# Patient Record
Sex: Female | Born: 1995 | Race: White | Hispanic: No | Marital: Single | Smoking: Never smoker
Health system: Southern US, Community
[De-identification: ages and names within clinical notes are randomized; demographics above are authoritative.]

## PROBLEM LIST (undated history)

## (undated) DIAGNOSIS — G43909 Migraine, unspecified, not intractable, without status migrainosus: Secondary | ICD-10-CM

## (undated) DIAGNOSIS — E785 Hyperlipidemia, unspecified: Secondary | ICD-10-CM

## (undated) HISTORY — PX: CHOLECYSTECTOMY: SHX55

---
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
## 2017-11-24 ENCOUNTER — Emergency Department (HOSPITAL_BASED_OUTPATIENT_CLINIC_OR_DEPARTMENT_OTHER)
Admission: EM | Admit: 2017-11-24 | Discharge: 2017-11-25 | Disposition: A | Discharge status: Home / Self Care | Payer: Self-pay | Attending: Emergency Medicine | Admitting: Emergency Medicine

## 2017-11-24 ENCOUNTER — Encounter (HOSPITAL_BASED_OUTPATIENT_CLINIC_OR_DEPARTMENT_OTHER): Payer: Self-pay | Admitting: Adult Health

## 2017-11-24 ENCOUNTER — Other Ambulatory Visit: Payer: Self-pay

## 2017-11-24 ENCOUNTER — Emergency Department (HOSPITAL_BASED_OUTPATIENT_CLINIC_OR_DEPARTMENT_OTHER): Payer: Self-pay

## 2017-11-24 DIAGNOSIS — G43009 Migraine without aura, not intractable, without status migrainosus: Secondary | ICD-10-CM | POA: Insufficient documentation

## 2017-11-24 DIAGNOSIS — R0789 Other chest pain: Secondary | ICD-10-CM | POA: Insufficient documentation

## 2017-11-24 DIAGNOSIS — H53149 Visual discomfort, unspecified: Secondary | ICD-10-CM | POA: Insufficient documentation

## 2017-11-24 HISTORY — DX: Hyperlipidemia, unspecified: E78.5

## 2017-11-24 HISTORY — DX: Migraine, unspecified, not intractable, without status migrainosus: G43.909

## 2017-11-24 MED ORDER — PROCHLORPERAZINE EDISYLATE 5 MG/ML IJ SOLN
5.0000 mg | Freq: Once | INTRAMUSCULAR | Status: DC
  Filled 2017-11-24: qty 2

## 2017-11-24 MED ORDER — IBUPROFEN 400 MG PO TABS: 400.0000 mg | ORAL_TABLET | Freq: Once | ORAL | Status: DC | PRN

## 2017-11-24 MED ORDER — KETOROLAC TROMETHAMINE 30 MG/ML IJ SOLN
30.0000 mg | Freq: Once | INTRAMUSCULAR | Status: DC
  Filled 2017-11-24: qty 1

## 2017-11-24 MED ORDER — DIPHENHYDRAMINE HCL 50 MG/ML IJ SOLN
25.0000 mg | Freq: Once | INTRAMUSCULAR | Status: DC
  Filled 2017-11-24: qty 1

## 2017-11-24 NOTE — ED Triage Notes (Addendum)
Presents with headache, "I typically get really bad migraines, so I went and laid down. I get dizzy with the migraiens but when I woke up at 5 pm the dizziness was bad and my chest was really tight" Endorses photophobia, chest tightness, dizziness and SOB with the chest pain.  Tried tylenol for pain without relief.

## 2017-11-24 NOTE — ED Notes (Addendum)
Alert, NAD, calm, interactive, resps e/u, speaking in clear complete sentences, no dyspnea noted, skin W&D, VSS, here for HA and CP, reports CP improved, now 3/10, HA remains 8/10, also mentions mild sob, blurry vision, light sensitivity, dizziness, (denies: fever, VD, syncope, palpitations, recent illness, cough, congestion, facial pain, or bleeding; also denies: leg pain, recent surgery, long distance travel, smoking or birth control pills). Some minimal relief with Tylenol PTA. Has been prescribed Fiorcet in the past. Has never been referred to/ seen by a neurologist. [facility]

## 2017-11-25 MED ORDER — KETOROLAC TROMETHAMINE 30 MG/ML IJ SOLN
30.0000 mg | Freq: Once | INTRAMUSCULAR | Status: AC
  Administered 2017-11-25: 30 mg via INTRAVENOUS

## 2017-11-25 MED ORDER — DIPHENHYDRAMINE HCL 50 MG/ML IJ SOLN
25.0000 mg | Freq: Once | INTRAMUSCULAR | Status: AC
  Administered 2017-11-25: 25 mg via INTRAVENOUS

## 2017-11-25 MED ORDER — PROCHLORPERAZINE EDISYLATE 5 MG/ML IJ SOLN
5.0000 mg | Freq: Once | INTRAMUSCULAR | Status: AC
  Administered 2017-11-25: 5 mg via INTRAVENOUS

## 2017-11-25 NOTE — ED Provider Notes (Signed)
[facility] EMERGENCY DEPARTMENT Provider Note   CSN: [id number] Arrival date & time: 11/24/17  1919     History   Chief Complaint Chief Complaint  Patient presents with  . Chest Pain    HPI Amber Watkins is a 22 y.o. female.  HPI  This is a 22 year old female with a history of migraines who presents with headache and chest pain.  Patient reports history of migraines.  However she has not had one in several years.  She has had progressive onset of anterior headache.  She took over-the-counter medications at home with minimal relief.  Currently she rates her pain 8 out of 10.  She also reports developing left-sided chest pain.  Denies shortness of breath.  Denies any recent cough or fevers.  No history of the same.  Nothing seems to make it better or worse.  She denies any vision changes.  She does report photophobia.  Past Medical History:  Diagnosis Date  . Hyperlipidemia   . Migraines     There are no active problems to display for this patient.   History reviewed. No pertinent surgical history.  OB History    No data available       Home Medications    Prior to Admission medications   Not on File    Family History History reviewed. No pertinent family history.  Social History Social History   Tobacco Use  . Smoking status: Never Smoker  . Smokeless tobacco: Never Used  Substance Use Topics  . Alcohol use: No    Frequency: Never  . Drug use: No     Allergies   Patient has no known allergies.   Review of Systems Review of Systems  Constitutional: Negative for fever.  Eyes: Positive for photophobia.  Respiratory: Negative for cough and shortness of breath.   Cardiovascular: Positive for chest pain. Negative for leg swelling.  Gastrointestinal: Negative for abdominal pain.  Neurological: Positive for headaches. Negative for dizziness.  All other systems reviewed and are negative.    Physical Exam Updated Vital Signs BP 109/70    Pulse (!) 58   Temp 98.6 F (37 C)   Resp 16   LMP 11/14/2017 (Exact Date)   SpO2 100%   Physical Exam  Constitutional: She is oriented to person, place, and time. She appears well-developed and well-nourished.  Overweight, no acute distress  HENT:  Head: Normocephalic and atraumatic.  Eyes: Pupils are equal, round, and reactive to light.  Neck: Neck supple.  Normal range of motion, no meningismus  Cardiovascular: Normal rate, regular rhythm and normal heart sounds.  Left-sided chest wall tenderness to palpation, no crepitus  Pulmonary/Chest: Effort normal. No respiratory distress. She has no wheezes.  Abdominal: Soft. Bowel sounds are normal.  Neurological: She is alert and oriented to person, place, and time.  Skin: Skin is warm and dry.  Psychiatric: She has a normal mood and affect.  Nursing note and vitals reviewed.    ED Treatments / Results  Labs (all labs ordered are listed, but only abnormal results are displayed) Labs Reviewed - No data to display  EKG  EKG Interpretation  Date/Time:  Sunday November 24 2017 19:25:55 EST Ventricular Rate:  70 PR Interval:  130 QRS Duration: 88 QT Interval:  394 QTC Calculation: 425 R Axis:   74 Text Interpretation:  Normal sinus rhythm with sinus arrhythmia Early repolarization Normal ECG Confirmed by Tilden Fossa [phone]) on 11/24/2017 7:33:58 PM Also confirmed by Ross Marcus ([id number])  on 11/24/2017 10:56:35 PM       Radiology Dg Chest 2 View  Result Date: 11/24/2017 CLINICAL DATA:  Chest pain. EXAM: CHEST  2 VIEW COMPARISON:  None FINDINGS: The cardiomediastinal silhouette is within normal limits. The lungs are well inflated and clear. There is no evidence of pleural effusion or pneumothorax. No acute osseous abnormality is identified. IMPRESSION: No active cardiopulmonary disease. Electronically Signed   By: Sebastian AcheAllen  Grady M.D.   On: 11/24/2017 21:02    Procedures Procedures (including critical care time)  Medications  Ordered in ED Medications  diphenhydrAMINE (BENADRYL) injection 25 mg (25 mg Intravenous Given 11/25/17 0009)  prochlorperazine (COMPAZINE) injection 5 mg (5 mg Intravenous Given 11/25/17 0009)  ketorolac (TORADOL) 30 MG/ML injection 30 mg (30 mg Intravenous Given 11/25/17 0009)     Initial Impression / Assessment and Plan / ED Course  I have reviewed the triage vital signs and the nursing notes.  Pertinent labs & imaging results that were available during my care of the patient were reviewed by me and considered in my medical decision making (see chart for details).     Patient presents with headache and chest pain.  Reports history of migraines.  No red flags of headache.  Doubt subarachnoid hemorrhage or meningitis.  Additionally, she has reproducible chest pain.  EKG and chest x-ray are reassuring.  No evidence of arrhythmia.  Vital signs are reassuring.  Doubt PE.  Patient was treated with a migraine cocktail.  On recheck, she states she feels much better.  Will discharge home with PCP follow-up.  After history, exam, and medical workup I feel the patient has been appropriately medically screened and is safe for discharge home. Pertinent diagnoses were discussed with the patient. Patient was given return precautions.   Final Clinical Impressions(s) / ED Diagnoses   Final diagnoses:  Migraine without aura and without status migrainosus, not intractable  Chest wall pain    ED Discharge Orders    None       Shon BatonHorton, Dwon Sky F, MD 11/25/17 0128

## 2018-01-20 ENCOUNTER — Telehealth: Payer: Self-pay | Admitting: Nutrition

## 2018-01-20 NOTE — Telephone Encounter (Deleted)
Message left on machine that I am available to see her on Wednesday at 9:30 or 10:30 for guardian start,  Left number for her to call back and confirm appt.

## 2018-01-29 NOTE — Telephone Encounter (Signed)
Opened in error

## 2018-09-07 IMAGING — DX DG CHEST 2V
2 series · 2 of 2 positions shown · non-contrast
Comparison: None

CLINICAL DATA: Chest pain.

EXAM:
CHEST  2 VIEW

[chest pa]
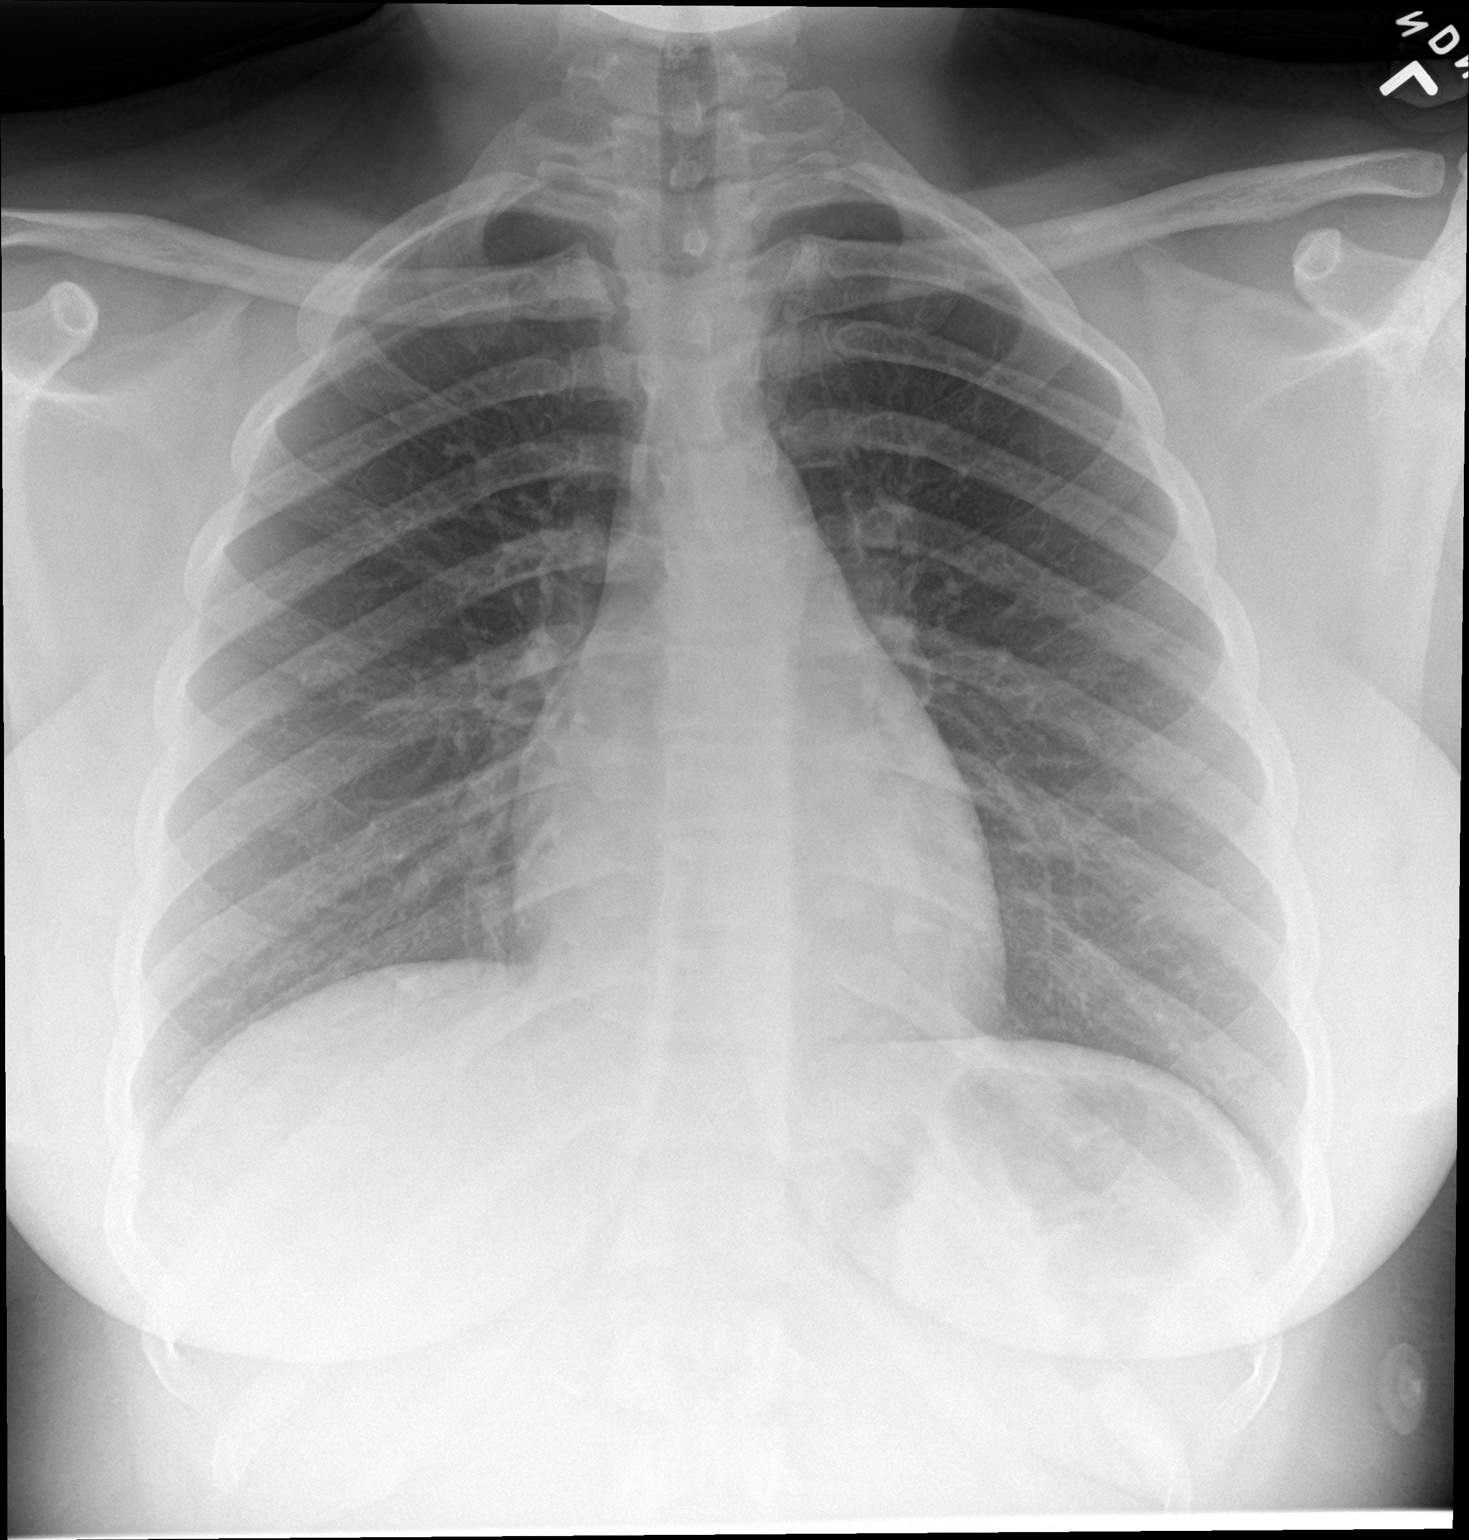

[chest lat]
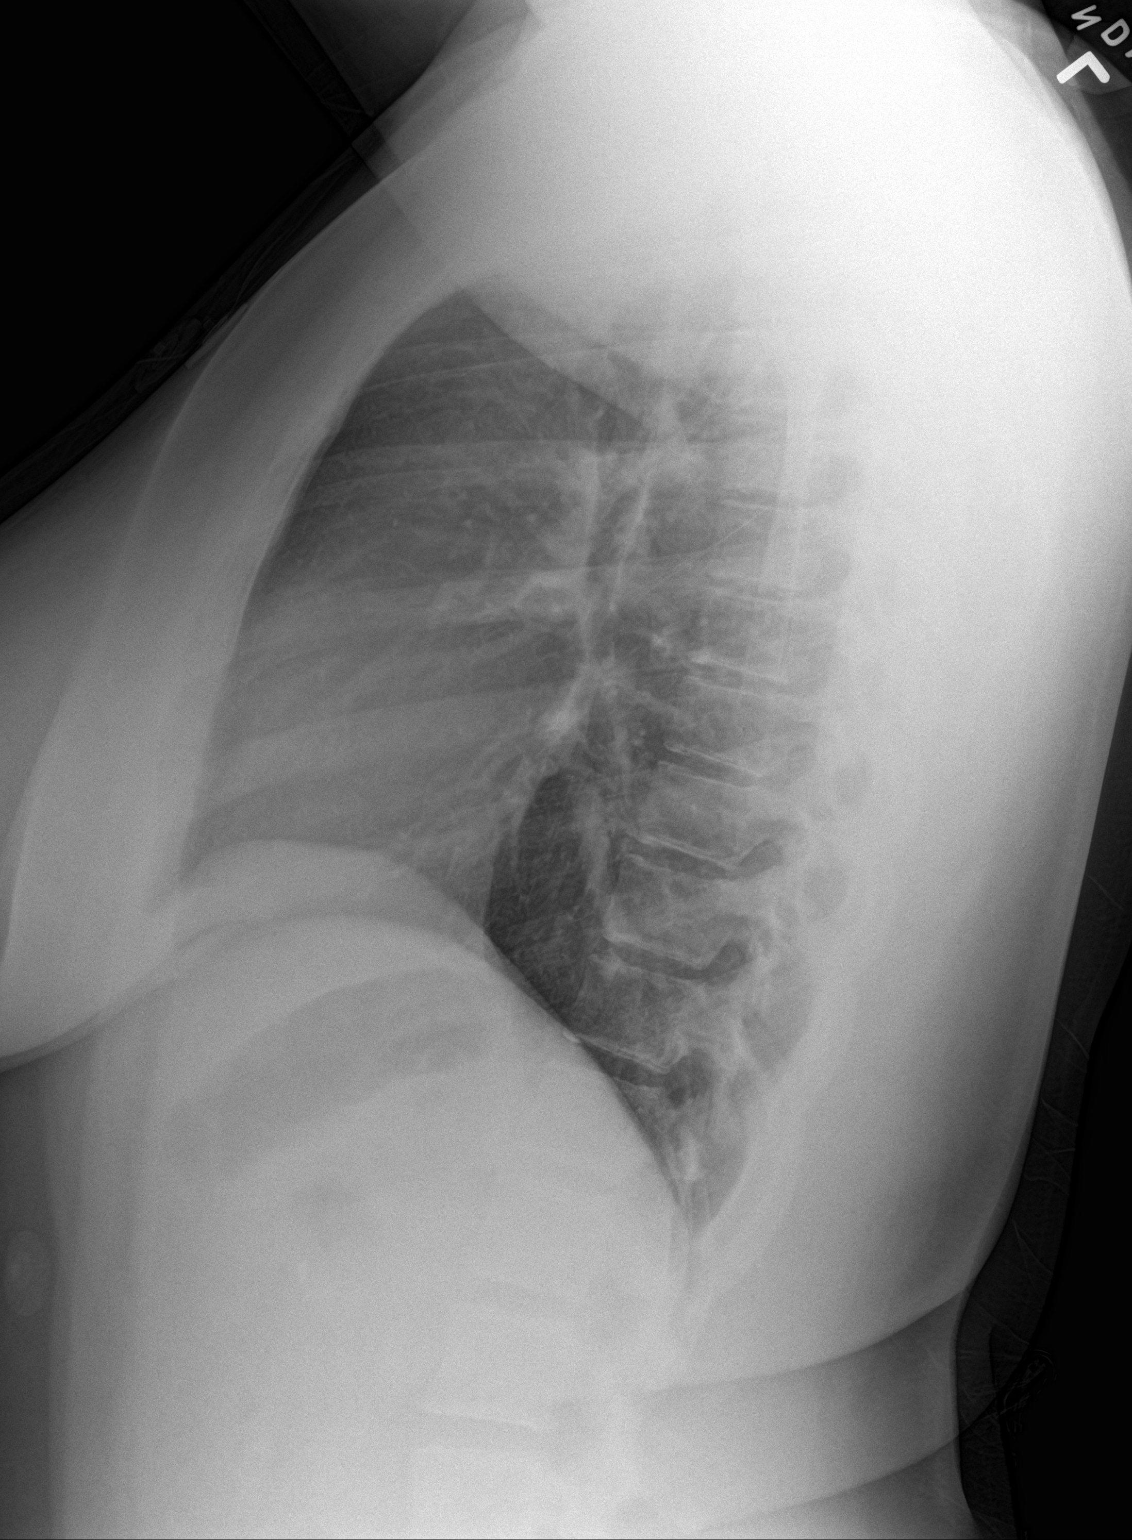

[2 of 2 positions shown; findings below may reference images not displayed]

FINDINGS: The cardiomediastinal silhouette is within normal limits. The lungs
are well inflated and clear. There is no evidence of pleural
effusion or pneumothorax. No acute osseous abnormality is
identified.
IMPRESSION: No active cardiopulmonary disease.

## 2018-11-20 ENCOUNTER — Encounter (HOSPITAL_BASED_OUTPATIENT_CLINIC_OR_DEPARTMENT_OTHER): Payer: Self-pay | Admitting: *Deleted

## 2018-11-20 ENCOUNTER — Emergency Department (HOSPITAL_BASED_OUTPATIENT_CLINIC_OR_DEPARTMENT_OTHER)
Admission: EM | Admit: 2018-11-20 | Discharge: 2018-11-20 | Disposition: A | Discharge status: Home / Self Care | Payer: Self-pay | Attending: Emergency Medicine | Admitting: Emergency Medicine

## 2018-11-20 ENCOUNTER — Other Ambulatory Visit: Payer: Self-pay

## 2018-11-20 DIAGNOSIS — J069 Acute upper respiratory infection, unspecified: Secondary | ICD-10-CM | POA: Insufficient documentation

## 2018-11-20 DIAGNOSIS — B9789 Other viral agents as the cause of diseases classified elsewhere: Secondary | ICD-10-CM | POA: Insufficient documentation

## 2018-11-20 DIAGNOSIS — Z79899 Other long term (current) drug therapy: Secondary | ICD-10-CM | POA: Insufficient documentation

## 2018-11-20 MED ORDER — BENZONATATE 100 MG PO CAPS: 100.0000 mg | ORAL_CAPSULE | Freq: Three times a day (TID) | ORAL | 0 refills | Status: AC

## 2018-11-20 NOTE — ED Notes (Signed)
ED Provider at bedside. 

## 2018-11-20 NOTE — Discharge Instructions (Signed)
I have prescribed tessalon pearls to help with your cough.Please purchase an expectorant like Mucinex to help loosen and thin the mucus production.You may also try some over the counter Robitussin to help with your cough. Continue to hydrate with plenty of fluids and Gatorade.If you experience any shortness of breath, chest pain or fever please return to the ED for reevaluation.

## 2018-11-20 NOTE — ED Provider Notes (Signed)
[facility] EMERGENCY DEPARTMENT Provider Note   CSN: [id number] Arrival date & time: 11/20/18  1313     History   Chief Complaint Chief Complaint  Patient presents with  . Cough    HPI Amber Watkins is a 23 y.o. female.  23 y.o female with a PMH of Migraines presents to the ED with a chief complaint of cough, sore throat x 2 days. She reports the cough is productive with slight yellow sputum. She reports taking mucinex with some relieve in symptoms. She also reports the cough is worse at night and making it harder to get rest. She also reports some chest pain when coughing. She denies any fever, shortness of breath or abdominal pain.      Past Medical History:  Diagnosis Date  . Hyperlipidemia   . Migraines     There are no active problems to display for this patient.   Past Surgical History:  Procedure Laterality Date  . CHOLECYSTECTOMY       OB History   No obstetric history on file.      Home Medications    Prior to Admission medications   Medication Sig Start Date End Date Taking? Authorizing Provider  guaiFENesin (MUCINEX) 600 MG 12 hr tablet Take 600 mg by mouth 2 (two) times daily.   Yes [provider]  benzonatate (TESSALON) 100 MG capsule Take 1 capsule (100 mg total) by mouth every 8 (eight) hours for 7 days. 11/20/18 11/27/18  Claude Manges, PA-C    Family History History reviewed. No pertinent family history.  Social History Social History   Tobacco Use  . Smoking status: Never Smoker  . Smokeless tobacco: Never Used  Substance Use Topics  . Alcohol use: No    Frequency: Never  . Drug use: No     Allergies   Patient has no known allergies.   Review of Systems Review of Systems  Constitutional: Negative for fever.  HENT: Positive for sinus pressure and sore throat.   Respiratory: Positive for cough.      Physical Exam Updated Vital Signs BP 120/84 (BP Location: Left Arm)   Pulse 84   Temp 99.5 F (37.5 C)  (Oral)   Resp 16   Ht 5\' 4"  (1.626 m)   Wt 92.5 kg   SpO2 100%   BMI 35.02 kg/m   Physical Exam Vitals signs and nursing note reviewed.  Constitutional:      General: She is not in acute distress.    Appearance: Normal appearance. She is well-developed. She is not ill-appearing or toxic-appearing.  HENT:     Head: Normocephalic and atraumatic.     Mouth/Throat:     Pharynx: Oropharynx is clear. No pharyngeal swelling, oropharyngeal exudate or posterior oropharyngeal erythema.     Tonsils: No tonsillar exudate or tonsillar abscesses.     Comments: Oropharynx looks clear, no erythema, edema, tonsillar exudates, or tonsillar abscess. Eyes:     Pupils: Pupils are equal, round, and reactive to light.  Neck:     Musculoskeletal: Normal range of motion.  Cardiovascular:     Rate and Rhythm: Regular rhythm.     Heart sounds: Normal heart sounds.  Pulmonary:     Effort: Pulmonary effort is normal. No respiratory distress.     Breath sounds: Normal breath sounds. No decreased breath sounds or wheezing.     Comments: Lung sounds throughout all fields.  Abdominal:     General: Bowel sounds are normal. There is no distension.  Palpations: Abdomen is soft.     Tenderness: There is no abdominal tenderness. There is no right CVA tenderness or left CVA tenderness. Negative signs include McBurney's sign.     Comments: No abdominal tenderness.   Musculoskeletal:        General: No tenderness or deformity.     Right lower leg: No edema.     Left lower leg: No edema.  Skin:    General: Skin is warm and dry.  Neurological:     Mental Status: She is alert and oriented to person, place, and time.      ED Treatments / Results  Labs (all labs ordered are listed, but only abnormal results are displayed) Labs Reviewed - No data to display  EKG None  Radiology No results found.  Procedures Procedures (including critical care time)  Medications Ordered in ED Medications - No data  to display   Initial Impression / Assessment and Plan / ED Course  I have reviewed the triage vital signs and the nursing notes.  Pertinent labs & imaging results that were available during my care of the patient were reviewed by me and considered in my medical decision making (see chart for details).     Patient presents with cough x 2 days. She has tried some over the counter medication with some relieve in symptoms. She denies any fever, low suspicion for pneumonia she is very well appearing. During evaluation there is no exudates on tonsils, centor criteria negative will no obtain PCR as low suspicion for strep.  Reports her recently sick with bronchitis.  Will provide patient with a prescription for Tessalon Perles to help with her cough along with a work note which she is currently requesting as she missed work yesterday as well.  Due to the symptoms beginning 2 days ago and had some improvement with Mucinex no further emergent work-up at this time.  Vitals stable during ED visit, patient satting at 100%, afebrile.  Return precautions discussed at length.  Final Clinical Impressions(s) / ED Diagnoses   Final diagnoses:  Viral URI with cough    ED Discharge Orders         Ordered    benzonatate (TESSALON) 100 MG capsule  Every 8 hours     11/20/18 1524           Claude Manges, PA-C 11/20/18 1528    Loren Racer, MD 11/22/18 [phone]

## 2018-11-20 NOTE — ED Triage Notes (Signed)
Pt c/o pro cough x 3 days  

## 2020-06-01 ENCOUNTER — Emergency Department (HOSPITAL_BASED_OUTPATIENT_CLINIC_OR_DEPARTMENT_OTHER): Payer: Self-pay

## 2020-06-01 ENCOUNTER — Emergency Department (HOSPITAL_BASED_OUTPATIENT_CLINIC_OR_DEPARTMENT_OTHER)
Admission: EM | Admit: 2020-06-01 | Discharge: 2020-06-02 | Disposition: A | Discharge status: Home / Self Care | Payer: Self-pay | Attending: Emergency Medicine | Admitting: Emergency Medicine

## 2020-06-01 ENCOUNTER — Encounter (HOSPITAL_BASED_OUTPATIENT_CLINIC_OR_DEPARTMENT_OTHER): Payer: Self-pay | Admitting: Emergency Medicine

## 2020-06-01 ENCOUNTER — Other Ambulatory Visit: Payer: Self-pay

## 2020-06-01 DIAGNOSIS — K529 Noninfective gastroenteritis and colitis, unspecified: Secondary | ICD-10-CM | POA: Insufficient documentation

## 2020-06-01 LAB — PREGNANCY, URINE: Preg Test, Ur: NEGATIVE

## 2020-06-01 LAB — URINALYSIS, MICROSCOPIC (REFLEX)

## 2020-06-01 LAB — COMPREHENSIVE METABOLIC PANEL
ALT: 115 U/L — ABNORMAL HIGH (ref 0–44)
AST: 62 U/L — ABNORMAL HIGH (ref 15–41)
Albumin: 4 g/dL (ref 3.5–5.0)
Alkaline Phosphatase: 53 U/L (ref 38–126)
Anion gap: 9 (ref 5–15)
BUN: 8 mg/dL (ref 6–20)
CO2: 26 mmol/L (ref 22–32)
Calcium: 8.6 mg/dL — ABNORMAL LOW (ref 8.9–10.3)
Chloride: 103 mmol/L (ref 98–111)
Creatinine, Ser: 0.97 mg/dL (ref 0.44–1.00)
GFR calc Af Amer: >60 mL/min (ref >60)
GFR calc non Af Amer: >60 mL/min (ref >60)
Glucose, Bld: 96 mg/dL (ref 70–99)
Potassium: 4.1 mmol/L (ref 3.5–5.1)
Sodium: 138 mmol/L (ref 135–145)
Total Bilirubin: 0.7 mg/dL (ref 0.3–1.2)
Total Protein: 7.7 g/dL (ref 6.5–8.1)

## 2020-06-01 LAB — CBC
HCT: 41.5 % (ref 36.0–46.0)
Hemoglobin: 13.7 g/dL (ref 12.0–15.0)
MCH: 30.1 pg (ref 26.0–34.0)
MCHC: 33 g/dL (ref 30.0–36.0)
MCV: 91.2 fL (ref 80.0–100.0)
Platelets: 235 10*3/uL (ref 150–400)
RBC: 4.55 MIL/uL (ref 3.87–5.11)
RDW: 13.1 % (ref 11.5–15.5)
WBC: 5.7 10*3/uL (ref 4.0–10.5)
nRBC: 0 % (ref 0.0–0.2)

## 2020-06-01 LAB — LIPASE, BLOOD: Lipase: 26 U/L (ref 11–51)

## 2020-06-01 LAB — URINALYSIS, ROUTINE W REFLEX MICROSCOPIC
Bilirubin Urine: NEGATIVE
Glucose, UA: NEGATIVE mg/dL
Hgb urine dipstick: NEGATIVE
Ketones, ur: NEGATIVE mg/dL
Leukocytes,Ua: NEGATIVE
Nitrite: NEGATIVE
Protein, ur: 30 mg/dL — AB
Specific Gravity, Urine: 1.025 (ref 1.005–1.030)
pH: 6 (ref 5.0–8.0)

## 2020-06-01 MED ORDER — IOHEXOL 300 MG/ML  SOLN
100.0000 mL | Freq: Once | INTRAMUSCULAR | Status: AC | PRN
  Administered 2020-06-01: 100 mL via INTRAVENOUS

## 2020-06-01 MED ORDER — ONDANSETRON HCL 4 MG/2ML IJ SOLN
4.0000 mg | Freq: Once | INTRAMUSCULAR | Status: AC
  Administered 2020-06-01: 23:00:00 4 mg via INTRAVENOUS
  Filled 2020-06-01: qty 2

## 2020-06-01 MED ORDER — ONDANSETRON 4 MG PO TBDP
4.0000 mg | ORAL_TABLET | Freq: Once | ORAL | Status: AC | PRN
  Administered 2020-06-01: 20:00:00 4 mg via ORAL
  Filled 2020-06-01: qty 1

## 2020-06-01 MED ORDER — SODIUM CHLORIDE 0.9 % IV BOLUS
1000.0000 mL | Freq: Once | INTRAVENOUS | Status: AC
  Administered 2020-06-01: 23:00:00 1000 mL via INTRAVENOUS

## 2020-06-01 MED ORDER — SODIUM CHLORIDE 0.9% FLUSH
3.0000 mL | Freq: Once | INTRAVENOUS | Status: DC
  Filled 2020-06-01: qty 3

## 2020-06-01 NOTE — ED Notes (Signed)
Pt unable to provide urine sample at this time 

## 2020-06-01 NOTE — ED Triage Notes (Signed)
  Patient comes in with abdominal pain that started on Saturday.  Patient states the pain is generalized and has had N/V/D off and on since Saturday.  Pain 8/10, cramping in abdomen.  Patient did have one episode of emesis earlier today.  Has had several episodes of diarrhea each day.

## 2020-06-01 NOTE — ED Notes (Signed)
Need recollect on u/a,

## 2020-06-01 NOTE — ED Provider Notes (Signed)
[facility] Provider Note: Lowella Dell, MD, FACEP  CSN: [id number] MRN: [id number] ARRIVAL: 06/01/20 at 2001 ROOM: [facility]06/[facility]06   CHIEF COMPLAINT  Abdominal Pain and Diarrhea   HISTORY OF PRESENT ILLNESS  06/01/20 11:04 PM Amber Watkins is a 24 y.o. female with abdominal pain for the past 4 days.  The pain is generalized and cramping in nature.  She rates it as an 8 out of 10, worse with movement or palpation.  It is been associated with nausea, vomiting and diarrhea which has been intermittent.  She did have one episode of vomiting earlier today.  She has had several episodes of diarrhea each of these days.  She has not taken anything for her symptoms except ibuprofen earlier today without relief.  She was given Zofran in triage which improved her nausea transiently but it has returned.  She denies vaginal bleeding or discharge.   Past Medical History:  Diagnosis Date   Hyperlipidemia    Migraines     Past Surgical History:  Procedure Laterality Date   CHOLECYSTECTOMY      History reviewed. No pertinent family history.  Social History   Tobacco Use   Smoking status: Never Smoker   Smokeless tobacco: Never Used  Vaping Use   Vaping Use: Never used  Substance Use Topics   Alcohol use: No   Drug use: No    Prior to Admission medications   Medication Sig Start Date End Date Taking? Authorizing Provider  dicyclomine (BENTYL) 20 MG tablet Take 1 tablet (20 mg total) by mouth 4 (four) times daily as needed (Abdominal cramping). 06/02/20   Naveen Lorusso, Jonny Ruiz, MD  ondansetron (ZOFRAN ODT) 8 MG disintegrating tablet Take 1 tablet (8 mg total) by mouth every 8 (eight) hours as needed for nausea or vomiting. 06/02/20   Oshae Simmering, Jonny Ruiz, MD    Allergies Patient has no known allergies.   REVIEW OF SYSTEMS  Negative except as noted here or in the History of Present Illness.   PHYSICAL EXAMINATION  Initial Vital Signs Blood pressure 123/65, pulse 62, temperature 98  F (36.7 C), temperature source Oral, resp. rate 18, height 5\' 4"  (1.626 m), weight 92.1 kg, SpO2 100 %.  Examination General: Well-developed, well-nourished female in no acute distress; appearance consistent with age of record HENT: normocephalic; atraumatic Eyes: pupils equal, round and reactive to light; extraocular muscles intact Neck: supple Heart: regular rate and rhythm Lungs: clear to auscultation bilaterally Abdomen: soft; nondistended; diffusely tender, most prominent in the left lower quadrant; no masses or hepatosplenomegaly; bowel sounds present Extremities: No deformity; full range of motion; pulses normal Neurologic: Awake, alert and oriented; motor function intact in all extremities and symmetric; no facial droop Skin: Warm and dry Psychiatric: Normal mood and affect   RESULTS  Summary of this visit's results, reviewed and interpreted by myself:   EKG Interpretation  Date/Time:    Ventricular Rate:    PR Interval:    QRS Duration:   QT Interval:    QTC Calculation:   R Axis:     Text Interpretation:        Laboratory Studies: Results for orders placed or performed during the hospital encounter of 06/01/20 (from the past 24 hour(s))  Lipase, blood     Status: None   Collection Time: 06/01/20  8:30 PM  Result Value Ref Range   Lipase 26 11 - 51 U/L  Comprehensive metabolic panel     Status: Abnormal   Collection Time: 06/01/20  8:30 PM  Result Value Ref Range   Sodium 138 135 - 145 mmol/L   Potassium 4.1 3.5 - 5.1 mmol/L   Chloride 103 98 - 111 mmol/L   CO2 26 22 - 32 mmol/L   Glucose, Bld 96 70 - 99 mg/dL   BUN 8 6 - 20 mg/dL   Creatinine, Ser 7.02 0.44 - 1.00 mg/dL   Calcium 8.6 (L) 8.9 - 10.3 mg/dL   Total Protein 7.7 6.5 - 8.1 g/dL   Albumin 4.0 3.5 - 5.0 g/dL   AST 62 (H) 15 - 41 U/L   ALT 115 (H) 0 - 44 U/L   Alkaline Phosphatase 53 38 - 126 U/L   Total Bilirubin 0.7 0.3 - 1.2 mg/dL   GFR calc non Af Amer >60 >60 mL/min   GFR calc Af Amer  >60 >60 mL/min   Anion gap 9 5 - 15  CBC     Status: None   Collection Time: 06/01/20  8:30 PM  Result Value Ref Range   WBC 5.7 4.0 - 10.5 K/uL   RBC 4.55 3.87 - 5.11 MIL/uL   Hemoglobin 13.7 12.0 - 15.0 g/dL   HCT 63.7 36 - 46 %   MCV 91.2 80.0 - 100.0 fL   MCH 30.1 26.0 - 34.0 pg   MCHC 33.0 30.0 - 36.0 g/dL   RDW 85.8 85.0 - 27.7 %   Platelets 235 150 - 400 K/uL   nRBC 0.0 0.0 - 0.2 %  Pregnancy, urine     Status: None   Collection Time: 06/01/20  8:32 PM  Result Value Ref Range   Preg Test, Ur NEGATIVE NEGATIVE  Urinalysis, Routine w reflex microscopic     Status: Abnormal   Collection Time: 06/01/20 11:10 PM  Result Value Ref Range   Color, Urine YELLOW YELLOW   APPearance HAZY (A) CLEAR   Specific Gravity, Urine 1.025 1.005 - 1.030   pH 6.0 5.0 - 8.0   Glucose, UA NEGATIVE NEGATIVE mg/dL   Hgb urine dipstick NEGATIVE NEGATIVE   Bilirubin Urine NEGATIVE NEGATIVE   Ketones, ur NEGATIVE NEGATIVE mg/dL   Protein, ur 30 (A) NEGATIVE mg/dL   Nitrite NEGATIVE NEGATIVE   Leukocytes,Ua NEGATIVE NEGATIVE  Urinalysis, Microscopic (reflex)     Status: Abnormal   Collection Time: 06/01/20 11:10 PM  Result Value Ref Range   RBC / HPF 0-5 0 - 5 RBC/hpf   WBC, UA 0-5 0 - 5 WBC/hpf   Bacteria, UA MANY (A) NONE SEEN   Squamous Epithelial / LPF 11-20 0 - 5   Mucus PRESENT    Imaging Studies: CT ABDOMEN PELVIS W CONTRAST  Result Date: 06/01/2020 CLINICAL DATA:  24 year old female with left lower quadrant pain with nausea vomiting and diarrhea for 4 days. EXAM: CT ABDOMEN AND PELVIS WITH CONTRAST TECHNIQUE: Multidetector CT imaging of the abdomen and pelvis was performed using the standard protocol following bolus administration of intravenous contrast. CONTRAST:  OMNIPAQUE IOHEXOL 300 MG/ML  SOLN COMPARISON:  None. FINDINGS: Lower chest: Negative. Hepatobiliary: Absent gallbladder. Negative liver. No bile duct enlargement. Pancreas: Negative. Spleen: Negative.  Splenule (normal  variant). Adrenals/Urinary Tract: Normal adrenal glands. Symmetric and normal bilateral renal enhancement. No nephrolithiasis, hydronephrosis, or perinephric stranding. Proximal ureters appear decompressed and normal. There are small bilateral pelvic phleboliths. The urinary bladder is decompressed and unremarkable. Stomach/Bowel: Fluid throughout nondilated large bowel, although no convincing bowel wall thickening (under distended sigmoid and rectum noted). No large bowel mesenteric stranding. Normal appendix (coronal image 40).  Negative terminal ileum. Fluid throughout nondilated small bowel. Negative stomach and duodenum. No free air, free fluid, or mesenteric stranding. Vascular/Lymphatic: Major arterial structures and the portal venous system appear patent and normal. No lymphadenopathy. Reproductive: Negative; retroverted uterus. Other: No pelvic free fluid. Musculoskeletal: Negative. IMPRESSION: 1. Fluid throughout the large bowel compatible with diarrhea. No convincing inflamed bowel segments. Normal appendix. 2. No other acute or inflammatory process identified in the abdomen or pelvis. Electronically Signed   By: Odessa Fleming M.D.   On: 06/01/2020 23:52    ED COURSE and MDM  Nursing notes, initial and subsequent vitals signs, including pulse oximetry, reviewed and interpreted by myself.  Vitals:   06/01/20 2014 06/01/20 2015  BP: 123/65   Pulse: 62   Resp: 18   Temp: 98 F (36.7 C)   TempSrc: Oral   SpO2: 100%   Weight:  92.1 kg  Height:  5\' 4"  (1.626 m)   Medications  sodium chloride flush (NS) 0.9 % injection 3 mL (3 mLs Intravenous Not Given 06/01/20 2326)  ondansetron (ZOFRAN-ODT) disintegrating tablet 4 mg (4 mg Oral Given 06/01/20 2022)  sodium chloride 0.9 % bolus 1,000 mL (1,000 mLs Intravenous New Bag/Given 06/01/20 2328)  ondansetron (ZOFRAN) injection 4 mg (4 mg Intravenous Given 06/01/20 2329)  iohexol (OMNIPAQUE) 300 MG/ML solution 100 mL (100 mLs Intravenous Contrast Given  06/01/20 2334)   Patient given a liter of normal saline for dehydration (urine was dark and has a high specific gravity).  Nausea controlled with IV Zofran.  Will treat with home Zofran and Bentyl.  Patient was also advised she may use over-the-counter Imodium.  Presentation consistent with a viral gastroenteritis.   PROCEDURES  Procedures   ED DIAGNOSES     ICD-10-CM   1. Gastroenteritis  K52.9        Hoy Fallert, MD 06/02/20 [phone]

## 2020-06-02 MED ORDER — DICYCLOMINE HCL 20 MG PO TABS: 20.0000 mg | ORAL_TABLET | Freq: Four times a day (QID) | ORAL | 0 refills | Status: AC | PRN

## 2020-06-02 MED ORDER — ONDANSETRON 8 MG PO TBDP: 8.0000 mg | ORAL_TABLET | Freq: Three times a day (TID) | ORAL | 0 refills | Status: AC | PRN

## 2020-06-24 ENCOUNTER — Other Ambulatory Visit: Payer: Self-pay

## 2020-06-24 ENCOUNTER — Encounter (HOSPITAL_BASED_OUTPATIENT_CLINIC_OR_DEPARTMENT_OTHER): Payer: Self-pay | Admitting: *Deleted

## 2020-06-24 ENCOUNTER — Emergency Department (HOSPITAL_BASED_OUTPATIENT_CLINIC_OR_DEPARTMENT_OTHER)
Admission: EM | Admit: 2020-06-24 | Discharge: 2020-06-24 | Disposition: A | Discharge status: Home / Self Care | Payer: Self-pay | Attending: Emergency Medicine | Admitting: Emergency Medicine

## 2020-06-24 DIAGNOSIS — Z3202 Encounter for pregnancy test, result negative: Secondary | ICD-10-CM | POA: Insufficient documentation

## 2020-06-24 LAB — PREGNANCY, URINE: Preg Test, Ur: NEGATIVE

## 2020-06-24 NOTE — ED Provider Notes (Signed)
[facility] EMERGENCY DEPARTMENT Provider Note   CSN: [id number] Arrival date & time: 06/24/20  1056     History Chief Complaint  Patient presents with   Possible Pregnancy    Amber Watkins is a 24 y.o. female with past medical history who presents for evaluation of possible pregnancy.  Last menstrual cycle 05/16/2020.  She is currently on birth control, Nexplanon insertion.  She is sexually active and intermittently uses protection.  She has no concerns for any STDs.  States she took a pregnancy test 2 weeks ago which was "lightly positive."  She followed with up with [facility] last week which was negative.  Patient states she is here to "just see if I am pregnant."  She took subsequent test at home which were negative.  She denies any headache, lightness, denies abdominal pain, pelvic pain, vaginal discharge, vaginal bleeding.  Denies aggravating relieving factors.  Admits to history of irregular menstrual cycles.  She is not followed up by OB/GYN.  Denies recent aggravating or relieving factors.  She also had negative pregnancy test here in the ED on 06/01/2020.  Denies additional aggravating or relieving factors.  History obtained from patient and past medical records. No interpretor was used.   HPI     Past Medical History:  Diagnosis Date   Hyperlipidemia    Migraines     There are no problems to display for this patient.   Past Surgical History:  Procedure Laterality Date   CHOLECYSTECTOMY       OB History   No obstetric history on file.     History reviewed. No pertinent family history.  Social History   Tobacco Use   Smoking status: Never Smoker   Smokeless tobacco: Never Used  Vaping Use   Vaping Use: Never used  Substance Use Topics   Alcohol use: No   Drug use: No    Home Medications Prior to Admission medications   Medication Sig Start Date End Date Taking? Authorizing Provider  dicyclomine (BENTYL) 20 MG tablet Take 1  tablet (20 mg total) by mouth 4 (four) times daily as needed (Abdominal cramping). 06/02/20   Molpus, Jonny Ruiz, MD  ondansetron (ZOFRAN ODT) 8 MG disintegrating tablet Take 1 tablet (8 mg total) by mouth every 8 (eight) hours as needed for nausea or vomiting. 06/02/20   Molpus, Jonny Ruiz, MD    Allergies    Patient has no known allergies.  Review of Systems   Review of Systems  Constitutional: Negative.   HENT: Negative.   Respiratory: Negative.   Cardiovascular: Negative.   Gastrointestinal: Negative.   Genitourinary: Negative.   Musculoskeletal: Negative.   Skin: Negative.   Neurological: Negative.   All other systems reviewed and are negative.   Physical Exam Updated Vital Signs BP 114/78 (BP Location: Right Arm)    Pulse 66    Temp 98.2 F (36.8 C) (Oral)    Resp 18    Ht 5\' 4"  (1.626 m)    Wt 90.7 kg    LMP 05/10/2020    SpO2 100%    BMI 34.33 kg/m   Physical Exam Vitals and nursing note reviewed.  Constitutional:      General: She is not in acute distress.    Appearance: She is well-developed. She is not ill-appearing or toxic-appearing.  HENT:     Head: Atraumatic.  Eyes:     Pupils: Pupils are equal, round, and reactive to light.  Cardiovascular:     Rate and Rhythm: Normal  rate.  Pulmonary:     Effort: No respiratory distress.  Abdominal:     General: Bowel sounds are normal. There is no distension.     Tenderness: There is no abdominal tenderness. There is no right CVA tenderness, left CVA tenderness, guarding or rebound.     Hernia: No hernia is present.  Musculoskeletal:        General: Normal range of motion.     Cervical back: Normal range of motion.  Skin:    General: Skin is warm and dry.  Neurological:     Mental Status: She is alert.     ED Results / Procedures / Treatments   Labs (all labs ordered are listed, but only abnormal results are displayed) Labs Reviewed  PREGNANCY, URINE    EKG None  Radiology No results  found.  Procedures Procedures (including critical care time)  Medications Ordered in ED Medications - No data to display  ED Course  I have reviewed the triage vital signs and the nursing notes.  Pertinent labs & imaging results that were available during my care of the patient were reviewed by me and considered in my medical decision making (see chart for details).  24 year old presents for evaluation of possible pregnancy.  LMP 05/16/2020.  History of abnormal menstrual cycles.  She is currently on Nexplanon birth control.  Sexually active her no concerns for STDs.  At home positive pregnancy test 2 weeks ago which was "light."  Subsequently followed up with negative home test and was seen by [facility] and had negative pregnancy test as well.  Patient here for additional pregnancy test.  She has no abdominal pain, vaginal bleeding, vaginal discharge or pelvic pain.  Just had STD testing done at [facility] this week.  Results were negative.  Pregnancy test here negative  Patient's delayed menstrual cycle likely similar to her prior history of abnormal menstrual cycles.  Discussed follow-up with OB/GYN.  Was given resources to follow-up outpatient.  She will return for any worsening symptoms.  The patient has been appropriately medically screened and/or stabilized in the ED. I have low suspicion for any other emergent medical condition which would require further screening, evaluation or treatment in the ED or require inpatient management.  Patient is hemodynamically stable and in no acute distress.  Patient able to ambulate in department prior to ED.  Evaluation does not show acute pathology that would require ongoing or additional emergent interventions while in the emergency department or further inpatient treatment.  I have discussed the diagnosis with the patient and answered all questions.  Pain is been managed while in the emergency department and patient has no further  complaints prior to discharge.  Patient is comfortable with plan discussed in room and is stable for discharge at this time.  I have discussed strict return precautions for returning to the emergency department.  Patient was encouraged to follow-up with PCP/specialist refer to at discharge.    MDM Rules/Calculators/A&P                           Final Clinical Impression(s) / ED Diagnoses Final diagnoses:  Encounter for pregnancy test with result negative    Rx / DC Orders ED Discharge Orders    None       Leverett Camplin A, PA-C 06/24/20 1240    Cathren Laine, MD 06/24/20 1501

## 2020-06-24 NOTE — ED Triage Notes (Signed)
Pt requests pregnancy test, denies any c/o. Pt states she had a + HPT and then followed up with [facility] with a test there which was negative. "I'm just trying to figure out what is going on."

## 2022-09-26 ENCOUNTER — Emergency Department (HOSPITAL_BASED_OUTPATIENT_CLINIC_OR_DEPARTMENT_OTHER)
Admission: EM | Admit: 2022-09-26 | Discharge: 2022-09-26 | Disposition: A | Discharge status: Home / Self Care | Payer: Federal, State, Local not specified - PPO | Attending: Emergency Medicine | Admitting: Emergency Medicine

## 2022-09-26 ENCOUNTER — Other Ambulatory Visit (HOSPITAL_BASED_OUTPATIENT_CLINIC_OR_DEPARTMENT_OTHER): Payer: Self-pay

## 2022-09-26 ENCOUNTER — Encounter (HOSPITAL_BASED_OUTPATIENT_CLINIC_OR_DEPARTMENT_OTHER): Payer: Self-pay | Admitting: Pediatrics

## 2022-09-26 ENCOUNTER — Other Ambulatory Visit: Payer: Self-pay

## 2022-09-26 DIAGNOSIS — N6452 Nipple discharge: Secondary | ICD-10-CM | POA: Diagnosis present

## 2022-09-26 LAB — PREGNANCY, URINE: Preg Test, Ur: NEGATIVE

## 2022-09-26 MED ORDER — AMOXICILLIN-POT CLAVULANATE 875-125 MG PO TABS
1.0000 | ORAL_TABLET | Freq: Two times a day (BID) | ORAL | 0 refills | Status: AC
  Filled 2022-09-26: qty 14, 7d supply, fill #0

## 2022-09-26 NOTE — ED Provider Notes (Signed)
[facility] EMERGENCY DEPARTMENT Provider Note   CSN: [id number] Arrival date & time: 09/26/22  1037     History  Chief Complaint  Patient presents with   Breast Discharge    Amber Watkins is a 26 y.o. female.  Patient here for some nipple discharge that she has had over the last 24 hours.  She had a little bit from both breasts.  She denies any fevers or chills.  She is had a child 10 months ago but she never breast-fed.  She had negative pregnancy test 2 days ago.  She is 4 days late on her cycle.  Patient denies any nausea or vomiting or abdominal pain.  The history is provided by the patient.       Home Medications Prior to Admission medications   Medication Sig Start Date End Date Taking? Authorizing Provider  amoxicillin-clavulanate (AUGMENTIN) 875-125 MG tablet Take 1 tablet by mouth every 12 (twelve) hours. 09/26/22  Yes Miloh Alcocer, DO  dicyclomine (BENTYL) 20 MG tablet Take 1 tablet (20 mg total) by mouth 4 (four) times daily as needed (Abdominal cramping). 06/02/20   Molpus, Jonny Ruiz, MD  ondansetron (ZOFRAN ODT) 8 MG disintegrating tablet Take 1 tablet (8 mg total) by mouth every 8 (eight) hours as needed for nausea or vomiting. 06/02/20   Molpus, Jonny Ruiz, MD      Allergies    Patient has no known allergies.    Review of Systems   Review of Systems  Physical Exam Updated Vital Signs BP 110/70 (BP Location: Left Arm)   Pulse 85   Temp 98.2 F (36.8 C) (Oral)   Resp 18   Ht 5\' 4"  (1.626 m)   Wt 108.9 kg   LMP 08/24/2022   SpO2 100%   BMI 41.20 kg/m  Physical Exam Vitals and nursing note reviewed. Exam conducted with a chaperone present.  Constitutional:      General: She is not in acute distress.    Appearance: She is well-developed.  HENT:     Head: Normocephalic and atraumatic.     Mouth/Throat:     Mouth: Mucous membranes are moist.  Eyes:     Extraocular Movements: Extraocular movements intact.     Conjunctiva/sclera: Conjunctivae normal.      Pupils: Pupils are equal, round, and reactive to light.  Cardiovascular:     Rate and Rhythm: Normal rate and regular rhythm.     Pulses: Normal pulses.     Heart sounds: Normal heart sounds. No murmur heard. Pulmonary:     Effort: Pulmonary effort is normal. No respiratory distress.     Breath sounds: Normal breath sounds.  Abdominal:     Palpations: Abdomen is soft.     Tenderness: There is no abdominal tenderness.  Musculoskeletal:        General: No swelling.     Cervical back: Neck supple.  Skin:    General: Skin is warm and dry.     Capillary Refill: Capillary refill takes less than 2 seconds.     Comments: No obvious nipple discharge on exam, no obvious abscess or redness or irregularity of the nipple or breast tissue bilaterally  Neurological:     Mental Status: She is alert.  Psychiatric:        Mood and Affect: Mood normal.     ED Results / Procedures / Treatments   Labs (all labs ordered are listed, but only abnormal results are displayed) Labs Reviewed  PREGNANCY, URINE    EKG  None  Radiology No results found.  Procedures Procedures    Medications Ordered in ED Medications - No data to display  ED Course/ Medical Decision Making/ A&P                           Medical Decision Making Amount and/or Complexity of Data Reviewed Labs: ordered.  Risk Prescription drug management.   Amber Watkins is here with breast tenderness and nipple discharge.  Normal vitals.  No fever.  Well-appearing.  No obvious infectious process on exam.  Nipples are well-appearing.  There is no obvious discharge.  There is no fluctuance or warmth or erythema.  Pregnancy test here is negative.  Not sure maybe if there is a infectious process so we will treat with Augmentin.  She will need follow-up with primary care and OB/GYN.  This could be prolactin issue and follow-up/outpatient work-up can be done.  Discharged in good condition.  Understands return precautions.  This  chart was dictated using voice recognition software.  Despite best efforts to proofread,  errors can occur which can change the documentation meaning.         Final Clinical Impression(s) / ED Diagnoses Final diagnoses:  Breast discharge    Rx / DC Orders ED Discharge Orders          Ordered    amoxicillin-clavulanate (AUGMENTIN) 875-125 MG tablet  Every 12 hours        09/26/22 1115              Virgina Norfolk, DO 09/26/22 1118

## 2022-09-26 NOTE — ED Triage Notes (Signed)
Reported discharge on bilateral breast, with blood tinged on right side. Stated breast feels tender as well; has a 44 month old at home but never breast-fed. Stated home pregnancy test 2 days ago was negative and she is 4 days late on cycle.

## 2022-09-26 NOTE — Discharge Instructions (Addendum)
Take antibiotic as prescribed follow-up with your OB/GYN or primary care doctor.

## 2022-09-26 NOTE — ED Notes (Signed)
Chaperoned breast exam with provider, pt tolerated well.  Up to BR ad lib to collect urine spec.

## 2022-10-04 ENCOUNTER — Other Ambulatory Visit (HOSPITAL_BASED_OUTPATIENT_CLINIC_OR_DEPARTMENT_OTHER): Payer: Self-pay

## 2023-02-03 ENCOUNTER — Other Ambulatory Visit: Payer: Self-pay

## 2023-02-03 ENCOUNTER — Encounter (HOSPITAL_BASED_OUTPATIENT_CLINIC_OR_DEPARTMENT_OTHER): Payer: Self-pay | Admitting: Emergency Medicine

## 2023-02-03 ENCOUNTER — Emergency Department (HOSPITAL_BASED_OUTPATIENT_CLINIC_OR_DEPARTMENT_OTHER)
Admission: EM | Admit: 2023-02-03 | Discharge: 2023-02-03 | Discharge status: Against Medical Advice | Payer: Medicaid Other | Attending: Emergency Medicine | Admitting: Emergency Medicine

## 2023-02-03 DIAGNOSIS — R519 Headache, unspecified: Secondary | ICD-10-CM | POA: Diagnosis not present

## 2023-02-03 DIAGNOSIS — H5713 Ocular pain, bilateral: Secondary | ICD-10-CM | POA: Insufficient documentation

## 2023-02-03 DIAGNOSIS — Z5321 Procedure and treatment not carried out due to patient leaving prior to being seen by health care provider: Secondary | ICD-10-CM | POA: Diagnosis not present

## 2023-02-03 MED ORDER — ACETAMINOPHEN 325 MG PO TABS
650.0000 mg | ORAL_TABLET | Freq: Once | ORAL | Status: AC
  Administered 2023-02-03: 650 mg via ORAL
  Filled 2023-02-03: qty 2

## 2023-02-03 NOTE — ED Notes (Signed)
Pt called for room, no answer x2

## 2023-02-03 NOTE — ED Notes (Signed)
Pt called for room, no answer x1 

## 2023-02-03 NOTE — ED Triage Notes (Addendum)
Pt here for bilateral eye pain and drainage x3 days and HA. Pt states she noticed some blood coming from the R eye, pt has mild redness in eyes, pt states pain is behind her eyes, denies itchiness. No obvious drainage noted in triage.

## 2023-05-19 ENCOUNTER — Encounter (HOSPITAL_BASED_OUTPATIENT_CLINIC_OR_DEPARTMENT_OTHER): Payer: Self-pay

## 2023-05-19 ENCOUNTER — Emergency Department (HOSPITAL_BASED_OUTPATIENT_CLINIC_OR_DEPARTMENT_OTHER)
Admission: EM | Admit: 2023-05-19 | Discharge: 2023-05-20 | Disposition: A | Discharge status: Home / Self Care | Payer: Medicaid Other | Attending: Emergency Medicine | Admitting: Emergency Medicine

## 2023-05-19 ENCOUNTER — Other Ambulatory Visit: Payer: Self-pay

## 2023-05-19 DIAGNOSIS — Z3A32 32 weeks gestation of pregnancy: Secondary | ICD-10-CM | POA: Insufficient documentation

## 2023-05-19 DIAGNOSIS — D649 Anemia, unspecified: Secondary | ICD-10-CM

## 2023-05-19 DIAGNOSIS — R7309 Other abnormal glucose: Secondary | ICD-10-CM | POA: Diagnosis not present

## 2023-05-19 DIAGNOSIS — O99013 Anemia complicating pregnancy, third trimester: Secondary | ICD-10-CM | POA: Diagnosis not present

## 2023-05-19 DIAGNOSIS — R42 Dizziness and giddiness: Secondary | ICD-10-CM

## 2023-05-19 DIAGNOSIS — E039 Hypothyroidism, unspecified: Secondary | ICD-10-CM | POA: Diagnosis not present

## 2023-05-19 DIAGNOSIS — R519 Headache, unspecified: Secondary | ICD-10-CM

## 2023-05-19 DIAGNOSIS — O26893 Other specified pregnancy related conditions, third trimester: Secondary | ICD-10-CM | POA: Diagnosis present

## 2023-05-19 LAB — CBC WITH DIFFERENTIAL/PLATELET
Abs Immature Granulocytes: 0.27 10*3/uL — ABNORMAL HIGH (ref 0.00–0.07)
Basophils Absolute: 0.1 10*3/uL (ref 0.0–0.1)
Basophils Relative: 1 %
Eosinophils Absolute: 0.1 10*3/uL (ref 0.0–0.5)
Eosinophils Relative: 1 %
HCT: 28.4 % — ABNORMAL LOW (ref 36.0–46.0)
Hemoglobin: 8.9 g/dL — ABNORMAL LOW (ref 12.0–15.0)
Immature Granulocytes: 2 %
Lymphocytes Relative: 21 %
Lymphs Abs: 2.4 10*3/uL (ref 0.7–4.0)
MCH: 25.5 pg — ABNORMAL LOW (ref 26.0–34.0)
MCHC: 31.3 g/dL (ref 30.0–36.0)
MCV: 81.4 fL (ref 80.0–100.0)
Monocytes Absolute: 0.9 10*3/uL (ref 0.1–1.0)
Monocytes Relative: 7 %
Neutro Abs: 7.9 10*3/uL — ABNORMAL HIGH (ref 1.7–7.7)
Neutrophils Relative %: 68 %
Platelets: 268 10*3/uL (ref 150–400)
RBC: 3.49 MIL/uL — ABNORMAL LOW (ref 3.87–5.11)
RDW: 16.5 % — ABNORMAL HIGH (ref 11.5–15.5)
WBC: 11.6 10*3/uL — ABNORMAL HIGH (ref 4.0–10.5)
nRBC: 0 % (ref 0.0–0.2)

## 2023-05-19 LAB — URINALYSIS, ROUTINE W REFLEX MICROSCOPIC
Bilirubin Urine: NEGATIVE
Glucose, UA: >=500 mg/dL — AB
Hgb urine dipstick: NEGATIVE
Ketones, ur: 15 mg/dL — AB
Leukocytes,Ua: NEGATIVE
Nitrite: NEGATIVE
Protein, ur: 30 mg/dL — AB
Specific Gravity, Urine: >=1.03 (ref 1.005–1.030)
pH: 6 (ref 5.0–8.0)

## 2023-05-19 LAB — COMPREHENSIVE METABOLIC PANEL
ALT: 14 U/L (ref 0–44)
AST: 16 U/L (ref 15–41)
Albumin: 2.6 g/dL — ABNORMAL LOW (ref 3.5–5.0)
Alkaline Phosphatase: 92 U/L (ref 38–126)
Anion gap: 9 (ref 5–15)
BUN: 7 mg/dL (ref 6–20)
CO2: 19 mmol/L — ABNORMAL LOW (ref 22–32)
Calcium: 8.4 mg/dL — ABNORMAL LOW (ref 8.9–10.3)
Chloride: 105 mmol/L (ref 98–111)
Creatinine, Ser: 0.64 mg/dL (ref 0.44–1.00)
GFR, Estimated: >60 mL/min (ref >60)
Glucose, Bld: 119 mg/dL — ABNORMAL HIGH (ref 70–99)
Potassium: 3.4 mmol/L — ABNORMAL LOW (ref 3.5–5.1)
Sodium: 133 mmol/L — ABNORMAL LOW (ref 135–145)
Total Bilirubin: 0.3 mg/dL (ref 0.3–1.2)
Total Protein: 7 g/dL (ref 6.5–8.1)

## 2023-05-19 LAB — URINALYSIS, MICROSCOPIC (REFLEX)

## 2023-05-19 LAB — CBG MONITORING, ED: Glucose-Capillary: 135 mg/dL — ABNORMAL HIGH (ref 70–99)

## 2023-05-19 MED ORDER — PROCHLORPERAZINE EDISYLATE 10 MG/2ML IJ SOLN
10.0000 mg | Freq: Once | INTRAMUSCULAR | Status: AC
  Administered 2023-05-19: 10 mg via INTRAVENOUS
  Filled 2023-05-19: qty 2

## 2023-05-19 MED ORDER — BUTALBITAL-APAP-CAFFEINE 50-325-40 MG PO TABS: 1.0000 | ORAL_TABLET | Freq: Four times a day (QID) | ORAL | 0 refills | Status: AC | PRN

## 2023-05-19 MED ORDER — LACTATED RINGERS IV BOLUS
1000.0000 mL | Freq: Once | INTRAVENOUS | Status: AC
  Administered 2023-05-19: 1000 mL via INTRAVENOUS

## 2023-05-19 MED ORDER — DIPHENHYDRAMINE HCL 50 MG/ML IJ SOLN
12.5000 mg | Freq: Once | INTRAMUSCULAR | Status: AC
  Administered 2023-05-19: 12.5 mg via INTRAVENOUS
  Filled 2023-05-19: qty 1

## 2023-05-19 NOTE — ED Notes (Signed)
Check CBG 135, RN Wes informed

## 2023-05-19 NOTE — ED Notes (Signed)
Pt ambulated to the bathroom and back w/ no issues noted.  Pt has a steady gait.

## 2023-05-19 NOTE — ED Provider Notes (Signed)
[place] EMERGENCY DEPARTMENT AT [facility] Provider Note   CSN: [id number] Arrival date & time: 05/19/23  1812     History  Chief Complaint  Patient presents with   Dizziness    Amber Watkins is a 27 y.o. female.  HPI     26yo female G3 P1-0-1-1 at [redacted]wk gestation, history of diabetes in pregnancy, anemia with hemoglobin of 8.8 in another emergency department earlier this week, hypothyroid, hyperlipidemia, anxiety, depression who presents with concern for continued lightheadedness.   Had been seen in the [facility] ED for dizziness, blurred vision/seeing spots, headache and right upper quadrant pain.  Her blood pressure was found to be within normal limits.  She had labs obtained to evaluate for preeclampsia which were normal.  She was found to be anemic to 8.8   Constant feeling of lightheadedness since being seen in the ED before, 2-3 weeks Nausea, no vomiting Headache was getting better but came back again today, 9/10 7/10 now.  Didn't have headache medicine yet.  Tylenol was not helping. Did have headache med in ED which helped  Fatigue/all over weakness, no focal weakness, numbness Blurry vision, for 2 weeks, no double vision or missing pieces of vision. Constant. Diagnosed with chronic migraine but has not had problems in the last 3 years. Regular pregnancy related dyspnea but no significant or new change, no chest pain, no leg swelling or pain  Eating and drinking ok  No OB concerns, no leaking of fluid, VB, abd or back pain, contratractions, feels normal fetal movement.   No black or bloody stools    Past Medical History:  Diagnosis Date   Hyperlipidemia    Migraines      Home Medications Prior to Admission medications   Medication Sig Start Date End Date Taking? Authorizing Provider  butalbital-acetaminophen-caffeine (FIORICET) 50-325-40 MG tablet Take 1 tablet by mouth every 6 (six) hours as needed for headache (DO NOT USE MORE THAN 4-5 DAYS PER  MONTH.). 05/19/23 05/18/24 Yes Alvira Monday, MD  amoxicillin-clavulanate (AUGMENTIN) 875-125 MG tablet Take 1 tablet by mouth every 12 (twelve) hours. 09/26/22   Curatolo, Adam, DO  dicyclomine (BENTYL) 20 MG tablet Take 1 tablet (20 mg total) by mouth 4 (four) times daily as needed (Abdominal cramping). 06/02/20   Molpus, Jonny Ruiz, MD  ondansetron (ZOFRAN ODT) 8 MG disintegrating tablet Take 1 tablet (8 mg total) by mouth every 8 (eight) hours as needed for nausea or vomiting. 06/02/20   Molpus, Jonny Ruiz, MD      Allergies    Coconut (cocos nucifera)    Review of Systems   Review of Systems  Physical Exam Updated Vital Signs BP 126/72 (BP Location: Right Arm)   Pulse 69   Temp 98.1 F (36.7 C) (Oral)   Resp 16   Ht 5\' 4"  (1.626 m)   Wt 101.2 kg   LMP 08/24/2022   SpO2 100%   BMI 38.28 kg/m  Physical Exam Vitals and nursing note reviewed.  Constitutional:      General: She is not in acute distress.    Appearance: Normal appearance. She is well-developed. She is not ill-appearing or diaphoretic.  HENT:     Head: Normocephalic and atraumatic.  Eyes:     General: No visual field deficit.    Extraocular Movements: Extraocular movements intact.     Conjunctiva/sclera: Conjunctivae normal.     Pupils: Pupils are equal, round, and reactive to light.  Cardiovascular:     Rate and Rhythm: Normal rate and  regular rhythm.     Pulses: Normal pulses.     Heart sounds: Normal heart sounds. No murmur heard.    No friction rub. No gallop.  Pulmonary:     Effort: Pulmonary effort is normal. No respiratory distress.     Breath sounds: Normal breath sounds. No wheezing or rales.  Abdominal:     General: There is no distension.     Palpations: Abdomen is soft.     Tenderness: There is no abdominal tenderness. There is no guarding.     Comments: Gravid    Musculoskeletal:        General: No swelling or tenderness.     Cervical back: Normal range of motion.  Skin:    General: Skin is warm and  dry.     Findings: No erythema or rash.  Neurological:     General: No focal deficit present.     Mental Status: She is alert and oriented to person, place, and time.     GCS: GCS eye subscore is 4. GCS verbal subscore is 5. GCS motor subscore is 6.     Cranial Nerves: No cranial nerve deficit, dysarthria or facial asymmetry.     Sensory: No sensory deficit.     Motor: No weakness or tremor.     Coordination: Coordination normal. Finger-Nose-Finger Test normal.     Gait: Gait normal.     ED Results / Procedures / Treatments   Labs (all labs ordered are listed, but only abnormal results are displayed) Labs Reviewed  CBC WITH DIFFERENTIAL/PLATELET - Abnormal; Notable for the following components:      Result Value   WBC 11.6 (*)    RBC 3.49 (*)    Hemoglobin 8.9 (*)    HCT 28.4 (*)    MCH 25.5 (*)    RDW 16.5 (*)    Neutro Abs 7.9 (*)    Abs Immature Granulocytes 0.27 (*)    All other components within normal limits  URINALYSIS, ROUTINE W REFLEX MICROSCOPIC - Abnormal; Notable for the following components:   Glucose, UA >=500 (*)    Ketones, ur 15 (*)    Protein, ur 30 (*)    All other components within normal limits  COMPREHENSIVE METABOLIC PANEL - Abnormal; Notable for the following components:   Sodium 133 (*)    Potassium 3.4 (*)    CO2 19 (*)    Glucose, Bld 119 (*)    Calcium 8.4 (*)    Albumin 2.6 (*)    All other components within normal limits  URINALYSIS, MICROSCOPIC (REFLEX) - Abnormal; Notable for the following components:   Bacteria, UA FEW (*)    All other components within normal limits  CBG MONITORING, ED - Abnormal; Notable for the following components:   Glucose-Capillary 135 (*)    All other components within normal limits    EKG EKG Interpretation Date/Time:  Sunday May 19 2023 18:42:40 EDT Ventricular Rate:  97 PR Interval:  136 QRS Duration:  78 QT Interval:  337 QTC Calculation: 428 R Axis:   37  Text Interpretation: Sinus rhythm No  significant change since last tracing Confirmed by Alvira Monday ([id number]) on 05/19/2023 8:29:20 PM  Radiology No results found.  Procedures Procedures    Medications Ordered in ED Medications  lactated ringers bolus 1,000 mL (0 mLs Intravenous Stopped 05/19/23 2347)  prochlorperazine (COMPAZINE) injection 10 mg (10 mg Intravenous Given 05/19/23 2233)  diphenhydrAMINE (BENADRYL) injection 12.5 mg (12.5 mg Intravenous Given 05/19/23 2230)  ED Course/ Medical Decision Making/ A&P                               26yo female G3 P1-0-1-1 at [redacted]wk gestation, history of diabetes in pregnancy, anemia with hemoglobin of 8.8 in another emergency department earlier this week, hypothyroid, hyperlipidemia, anxiety, depression who presents with concern for continued lightheadedness.  EKG completed and personally about interpreted by me shows normal sinus rhythm without acute ST changes.  She does not have chest pain or shortness of breath to suggest acute PE nor pericardial effusion as an etiology of her lightheadedness.  Labs completed and personally about interpreted by me show hemoglobin 8.9, which is similar to what it had been on outpatient labs of 8.8.  No acute change or sign of active bleeding.  She does not have abdominal pain to suggest placental abruption or other acute obstetric complication.  Labs show mild hypokalemia, mildly decreased bicarb, no signs of UTI.   She is describing the dizziness more as a lightheadedness than a room spinning dizziness, has a normal neurologic exam, and have low suspicion for CVA.  She has had a headache that has been ongoing and began again today.  Do not feel her description of this headache is consistent with acute subarachnoid hemorrhage.  She has a normal neurologic exam including normal extraocular movements and coordination.  Low suspicion for intracranial hemorrhage, CVA> Discussed we can obtain CT head or transfer for MRI but at this time have low  suspicion for emergent pathology.  Consider IIH as etiology of headache, however with photo/phonophobia prior history of headaches feel migraines possible and do not see visual field changes or changes in EOM and feel outpatient headache evaluation is appropriate.  BP normal and had normal lab evaluation in terms of evaluating for preeclampsia.  Given headache cocktail and symptoms improved, ambulatory with steady gait.  Recommend follow up with OB.         Final Clinical Impression(s) / ED Diagnoses Final diagnoses:  Lightheadedness  Anemia, unspecified type  Acute nonintractable headache, unspecified headache type    Rx / DC Orders ED Discharge Orders          Ordered    butalbital-acetaminophen-caffeine (FIORICET) 50-325-40 MG tablet  Every 6 hours PRN        05/19/23 2334              Alvira Monday, MD 05/20/23 [phone]

## 2023-05-19 NOTE — ED Triage Notes (Addendum)
Pt arrives with c/o dizziness that started about 2 weeks ago. Pt is currently [redacted] weeks pregnant. Per pt, she does have low iron. Pt denies ABD pain, vaginal bleeding, or leaking fluid. Per pt, fetal movement is normal. Pt endorses headache and nausea. Pt denies vomiting. Pt does have gestational diabetes .

## 2023-05-20 NOTE — ED Notes (Signed)
D/c paperwork reviewed with pt, including prescriptions and follow up care.  No questions or concerns voiced at time of d/c. . Pt verbalized understanding, Ambulatory without assistance to ED exit, NAD.   

## 2024-03-31 ENCOUNTER — Ambulatory Visit

## 2024-08-13 NOTE — Nursing Note (Signed)
 Urine manual analysis  Leu neg Nit neg Uro 0.2 Pro trace pH 6.0  Blo neg Sg 1.020 Ket 40 Bil neg Glu 1

## 2024-08-17 NOTE — Telephone Encounter (Signed)
 Pt was seen in ER on 09/25. Sent by Dr. Verna for dizziness, vision disturbances and HA. Evaluated and discharged.   She called, identity confirmed, today reporting dizziness episodes occurring every 5-10 minutes, with unresolved HA's and continued visual disturbances. She also reports period  like cramps, across top of stomach, occurring throughout the day. She stated she feels hot and overheats with each cramp occurrence. She reports drinking 4-5 water bottles per day.  My recommendation was to return to Labor and Delivery for another workup. She agreed with plan. OCP, Dr. Lang, notified.  Daniela Swinford, RNBSN

## 2024-08-17 NOTE — Nursing Note (Signed)
 Amber Watkins - 28 y/o, G4P2 [redacted]w[redacted]d, hx c/s x2; arrived to labor and delivery unit c/o HA unrelieved by tylenol  and episodes of dizziness q20-30 min x15 days, also reports mild cramping in the upper part of her belly. Was here 9/25 for same complaint and d/c'ed. Reports + fetal mvmt. Endorses blurred vision, Denies RUQ pain. Denies LOF. No vaginal bleeding noted at this time. US /toco applied to gravid soft nontender abd.

## 2024-08-27 NOTE — Progress Notes (Signed)
 ROB, GBS done today Good fetal movement Pain in c section scar, painful contractions

## 2024-08-27 NOTE — Progress Notes (Signed)
 Briefly, Amber Watkins [id number] at [redacted]w[redacted]d Estimated Date of Delivery: 09/20/24  Reports recent cramping and contractions. Cervix fingertip, thick, high, firm. Discussed indications to go to labor and delivery GBS swab today  BP 128/86   Wt 93.9 kg (207 lb)   LMP 11/01/2023 (Approximate)   BMI 35.53 kg/m  Fundal Height- appropriate Fetal Heart Tones- present     Problem List Items Addressed This Visit       Pregnancy 2025   Pregnancy with history of cesarean section, antepartum (CMD)   Overview  Repeat scheduled for 10/27 at 715      Pregnancy (CMD) - Primary   Overview   Dating by 25wk u/s. Estimated Date of Delivery: 09/20/24.  Declines Panorama/horizon.  28wk--EFW 30%ile, AC 17%ile,Vertex, Anatomy incomplete for AA.   1 hour GTT: 129 32wk2d--anatomy completed, growth AGA GBS:  Pregravid BMI 30      Relevant Orders   Group B Strep Culture   Late prenatal care (CMD)   Overview  Started care at 25wks      Encounter for other specified antenatal screening   Overview   Diagnosis necessary for prenatal screening labs      Relevant Orders   Group B Strep Culture     Labor signs, pregnancy warning signs, and fetal movement counting reviewed (if applicable). All questions were answered.   Return in about 1 week (around 09/03/2024) for Prenatal visit.

## 2024-08-31 ENCOUNTER — Emergency Department (HOSPITAL_COMMUNITY)
Admission: EM | Admit: 2024-08-31 | Discharge: 2024-08-31 | Disposition: A | Discharge status: Home / Self Care | Attending: Emergency Medicine | Admitting: Emergency Medicine

## 2024-08-31 ENCOUNTER — Encounter (HOSPITAL_COMMUNITY): Payer: Self-pay

## 2024-08-31 DIAGNOSIS — R1030 Lower abdominal pain, unspecified: Secondary | ICD-10-CM | POA: Insufficient documentation

## 2024-08-31 DIAGNOSIS — R197 Diarrhea, unspecified: Secondary | ICD-10-CM | POA: Insufficient documentation

## 2024-08-31 DIAGNOSIS — R112 Nausea with vomiting, unspecified: Secondary | ICD-10-CM

## 2024-08-31 DIAGNOSIS — Z3A37 37 weeks gestation of pregnancy: Secondary | ICD-10-CM | POA: Diagnosis not present

## 2024-08-31 DIAGNOSIS — O219 Vomiting of pregnancy, unspecified: Secondary | ICD-10-CM | POA: Insufficient documentation

## 2024-08-31 LAB — CBC
HCT: 38 % (ref 36.0–46.0)
Hemoglobin: 12.1 g/dL (ref 12.0–15.0)
MCH: 30.5 pg (ref 26.0–34.0)
MCHC: 31.8 g/dL (ref 30.0–36.0)
MCV: 95.7 fL (ref 80.0–100.0)
Platelets: 217 K/uL (ref 150–400)
RBC: 3.97 MIL/uL (ref 3.87–5.11)
RDW: 14 % (ref 11.5–15.5)
WBC: 11.9 K/uL — ABNORMAL HIGH (ref 4.0–10.5)
nRBC: 0 % (ref 0.0–0.2)

## 2024-08-31 LAB — LIPASE, BLOOD: Lipase: 31 U/L (ref 11–51)

## 2024-08-31 LAB — BASIC METABOLIC PANEL WITH GFR
Anion gap: 11 (ref 5–15)
BUN: <5 mg/dL — ABNORMAL LOW (ref 6–20)
CO2: 21 mmol/L — ABNORMAL LOW (ref 22–32)
Calcium: 8.6 mg/dL — ABNORMAL LOW (ref 8.9–10.3)
Chloride: 103 mmol/L (ref 98–111)
Creatinine, Ser: 0.59 mg/dL (ref 0.44–1.00)
GFR, Estimated: >60 mL/min (ref >60)
Glucose, Bld: 79 mg/dL (ref 70–99)
Potassium: 3.8 mmol/L (ref 3.5–5.1)
Sodium: 135 mmol/L (ref 135–145)

## 2024-08-31 LAB — RESP PANEL BY RT-PCR (RSV, FLU A&B, COVID)  RVPGX2
Influenza A by PCR: NEGATIVE
Influenza B by PCR: NEGATIVE
Resp Syncytial Virus by PCR: NEGATIVE
SARS Coronavirus 2 by RT PCR: NEGATIVE

## 2024-08-31 MED ORDER — SODIUM CHLORIDE 0.9 % IV BOLUS
1000.0000 mL | Freq: Once | INTRAVENOUS | Status: AC
  Administered 2024-08-31: 1000 mL via INTRAVENOUS

## 2024-08-31 MED ORDER — ONDANSETRON HCL 4 MG/2ML IJ SOLN
4.0000 mg | Freq: Once | INTRAMUSCULAR | Status: AC
  Administered 2024-08-31: 4 mg via INTRAVENOUS
  Filled 2024-08-31: qty 2

## 2024-08-31 NOTE — Discharge Instructions (Addendum)
 Please follow-up with your OBGYN office tomorrow for this visit to the ER for nausea vomiting and abdominal upset.  If you have worsening abdominal pain, vaginal gush of fluid (water breaking), persistent contractions, vaginal bleeding, persistent vomiting, or any other emergency concerns, please contact your OBGYN service or report back to the emergency department for urgent concerns.

## 2024-08-31 NOTE — ED Notes (Signed)
[facility] called  

## 2024-08-31 NOTE — ED Triage Notes (Signed)
 Pt presents via POV c/o N/V/D and lower pelvic preassure. Denied contractions. Reports this is her 3rd child and has scheduled c section on 10/27/25with EDD on 09/20/24.   [facility] called and given report.   Pt receives care at [facility] per pt report

## 2024-08-31 NOTE — ED Provider Notes (Signed)
 [place] EMERGENCY DEPARTMENT AT [facility] Provider Note   CSN: [id number] Arrival date & time: 08/31/24  8084     Patient presents with: [redacted] weeks pregnant  and pelvic pressure   Amber Watkins is a 28 y.o. female at [redacted] weeks gestation, G3P2 here with nausea, vomiting, diarrhea x 2 days, feeling fatigued, some cramping lower abdominal pain but not persistent.  OBGYN is at [facility].  Planned C-section in 2 weeks.  Prior pregnancies were C-sections.  Hx of cholecystectomy as well.  No other abdominal surgeries.     HPI     Prior to Admission medications   Medication Sig Start Date End Date Taking? Authorizing Provider  amoxicillin -clavulanate (AUGMENTIN ) 875-125 MG tablet Take 1 tablet by mouth every 12 (twelve) hours. 09/26/22   Curatolo, Adam, DO  dicyclomine  (BENTYL ) 20 MG tablet Take 1 tablet (20 mg total) by mouth 4 (four) times daily as needed (Abdominal cramping). 06/02/20   Molpus, Norleen, MD  ondansetron  (ZOFRAN  ODT) 8 MG disintegrating tablet Take 1 tablet (8 mg total) by mouth every 8 (eight) hours as needed for nausea or vomiting. 06/02/20   Molpus, John, MD    Allergies: Coconut (cocos nucifera)    Review of Systems  Updated Vital Signs BP 122/81   Pulse 85   Temp 98.6 F (37 C) (Oral)   Resp 17   LMP 12/15/2023   SpO2 100%   Physical Exam Constitutional:      General: She is not in acute distress. HENT:     Head: Normocephalic and atraumatic.  Eyes:     Conjunctiva/sclera: Conjunctivae normal.     Pupils: Pupils are equal, round, and reactive to light.  Cardiovascular:     Rate and Rhythm: Normal rate and regular rhythm.  Pulmonary:     Effort: Pulmonary effort is normal. No respiratory distress.  Abdominal:     General: There is no distension.     Tenderness: There is no abdominal tenderness.     Comments: Gravid uterus  Skin:    General: Skin is warm and dry.  Neurological:     General: No focal deficit present.     Mental Status: She  is alert. Mental status is at baseline.  Psychiatric:        Mood and Affect: Mood normal.        Behavior: Behavior normal.     (all labs ordered are listed, but only abnormal results are displayed) Labs Reviewed  BASIC METABOLIC PANEL WITH GFR - Abnormal; Notable for the following components:      Result Value   CO2 21 (*)    BUN <5 (*)    Calcium 8.6 (*)    All other components within normal limits  CBC - Abnormal; Notable for the following components:   WBC 11.9 (*)    All other components within normal limits  RESP PANEL BY RT-PCR (RSV, FLU A&B, COVID)  RVPGX2  LIPASE, BLOOD  URINALYSIS, ROUTINE W REFLEX MICROSCOPIC    EKG: None  Radiology: No results found.   Procedures   Medications Ordered in the ED  sodium chloride  0.9 % bolus 1,000 mL (1,000 mLs Intravenous New Bag/Given 08/31/24 2053)  ondansetron  (ZOFRAN ) injection 4 mg (4 mg Intravenous Given 08/31/24 2054)    Clinical Course as of 08/31/24 2210  Mon Aug 31, 2024  2127 Feeling much better, po challenging now [MT]    Clinical Course User Index [MT] Gevorg Brum, Donnice PARAS, MD  Medical Decision Making Amount and/or Complexity of Data Reviewed Labs: ordered.  Risk Prescription drug management.   Ddx includes viral gastroenteritis most likely vs pancreatitis vs other  Toco monitoring reassuring; [facility] team at bedside, cervix 1 cm, no persistent contractions.  No concern for imminent labor or delivery  Labs reviewed personally, notable for no emergent findings  The patient does not have any dysuria or UTI symptoms.   IV fluids and zofran  given  On reassessment she is feeling much better and easily tolerating p.o., drank 2 cups of water and ate crackers as well.  She is wanting to go home and is stable for discharge.  I recommended she follow-up tomorrow morning with her OB/GYN office.  She is comfortable with this plan     Final diagnoses:  Nausea and vomiting,  unspecified vomiting type    ED Discharge Orders     None          Cottie Donnice PARAS, MD 08/31/24 2211

## 2024-08-31 NOTE — Progress Notes (Signed)
 1930: OBRRN called to [facility] for pt 37wks with c/o n/v/d, pelvic pressure, and back pain. Previous c/s.   1941: OBRRN at bedside, pt placed on monitor. Pt states n/v/d since around 1400- unable to keep anything down including water, spotting yesterday, no intercourse in past 24hrs. Endorses back pain- constant but gets worse at times, headache, and blurred vision which she thinks is due to dehydration. Denies LOF, any more vaginal bleeding, and intense ctx. Endorses fetal movement, and audible on monitor.   1958: SVE 1/thick/posterior.   2004: Dr. Herchel called and notified of pt G4P2 at 37.1 wks. Pt states n/v/d since around 1400- unable to keep anything down including water, spotting yesterday, no intercourse in past 24hrs. Endorses back pain- constant but gets worse at times, headache, and blurred vision which she thinks is due to dehydration. Denies LOF, any more vaginal bleeding, and intense ctx. Endorses fetal movement, and audible on monitor. Monitoring reactive and reassuring, irregular ctx and some UI noted. PNC at [facility], repeat c/s scheduled 10/27. OB cleared, orders for ED to manage, work-up for gastroenteritis if deemed appropriate my MD here.   2019: Monitors removed.
